# Patient Record
Sex: Female | Born: 2000 | Race: Black or African American | Hispanic: No | Marital: Single | Smoking: Never smoker
Health system: Southern US, Community
[De-identification: ages and names within clinical notes are randomized; demographics above are authoritative.]

---
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
## 2021-06-07 ENCOUNTER — Emergency Department
Admission: EM | Admit: 2021-06-07 | Discharge: 2021-06-07 | Disposition: A | Discharge status: Home / Self Care | Payer: Medicaid Other | Attending: Emergency Medicine | Admitting: Emergency Medicine

## 2021-06-07 ENCOUNTER — Other Ambulatory Visit: Payer: Self-pay

## 2021-06-07 ENCOUNTER — Emergency Department: Payer: Medicaid Other

## 2021-06-07 DIAGNOSIS — W208XXA Other cause of strike by thrown, projected or falling object, initial encounter: Secondary | ICD-10-CM | POA: Insufficient documentation

## 2021-06-07 DIAGNOSIS — M25571 Pain in right ankle and joints of right foot: Secondary | ICD-10-CM | POA: Insufficient documentation

## 2021-06-07 MED ORDER — MELOXICAM 15 MG PO TABS: 15.0000 mg | ORAL_TABLET | Freq: Every day | ORAL | 2 refills | Status: AC

## 2021-06-07 NOTE — ED Notes (Signed)
Ace wrap applied to the patients right ankle. ?

## 2021-06-07 NOTE — ED Triage Notes (Signed)
Pt presents to ER c/o right foot pain.  Pt states she was walking in [employer] when a box fell off the shelf and landed on her right foot.  Pt states she is having pain to lateral aspect of left foot at this time.  No swelling noted to foot.  Foot is tender to palpation.  Pt ambulatory to triage.  A&O x4 at this time in NAD.   ?

## 2021-06-07 NOTE — ED Provider Notes (Signed)
? ?[facility] ?Provider Note ? ?Patient Contact: 10:04 PM (approximate) ? ? ?History  ? ?Foot Pain ? ? ?HPI ? ?Kathy Blake is a 21 y.o. female presents to the emergency department with acute right ankle and right foot pain after a box fell on her foot at [facility].  Patient has had difficulty bearing weight since injury occurred.  No other new falls or mechanisms of trauma. ?  ? ? ?Physical Exam  ? ?Triage Vital Signs: ?ED Triage Vitals  ?Enc Vitals Group  ?   BP 06/07/21 1951 (!) 143/81  ?   Pulse Rate 06/07/21 1951 92  ?   Resp 06/07/21 1951 18  ?   Temp 06/07/21 1951 99.4 ?F (37.4 ?C)  ?   Temp Source 06/07/21 1951 Oral  ?   SpO2 06/07/21 1951 98 %  ?   Weight 06/07/21 1952 205 lb (93 kg)  ?   Height 06/07/21 1952 5\' 2"  (1.575 m)  ?   Head Circumference --   ?   Peak Flow --   ?   Pain Score 06/07/21 1952 8  ?   Pain Loc --   ?   Pain Edu? --   ?   Excl. in GC? --   ? ? ?Most recent vital signs: ?Vitals:  ? 06/07/21 1951  ?BP: (!) 143/81  ?Pulse: 92  ?Resp: 18  ?Temp: 99.4 ?F (37.4 ?C)  ?SpO2: 98%  ? ? ? ?General: Alert and in no acute distress. ?Eyes:  PERRL. EOMI. ?Head: No acute traumatic findings ?ENT ?     Ears:  ?     Nose: No congestion/rhinnorhea. ?     Mouth/Throat: Mucous membranes are moist. ?Neck: No stridor. No cervical spine tenderness to palpation. ?Cardiovascular:  Good peripheral perfusion ?Respiratory: Normal respiratory effort without tachypnea or retractions. Lungs CTAB. Good air entry to the bases with no decreased or absent breath sounds. ?Gastrointestinal: Bowel sounds ?4 quadrants. Soft and nontender to palpation. No guarding or rigidity. No palpable masses. No distention. No CVA tenderness. ?Musculoskeletal: Full range of motion to all extremities.  Patient performs full range of motion at the right ankle.  Palpable dorsalis pedis pulse, right.  Capillary refill less than 2 seconds on the right. ?Neurologic:  No gross focal neurologic deficits are  appreciated.  ?Skin:   No rash noted ?Other: ? ? ?ED Results / Procedures / Treatments  ? ?Labs ?(all labs ordered are listed, but only abnormal results are displayed) ?Labs Reviewed - No data to display ? ? ? ? ? ?RADIOLOGY ? ?I personally viewed and evaluated these images as part of my medical decision making, as well as reviewing the written report by the radiologist. ? ?ED Provider Interpretation: I personally reviewed x-rays of the right foot and ankle and no acute bony abnormality was visualized. ? ? ?PROCEDURES: ? ?Critical Care performed: No ? ?Procedures ? ? ?MEDICATIONS ORDERED IN ED: ?Medications - No data to display ? ? ?IMPRESSION / MDM / ASSESSMENT AND PLAN / ED COURSE  ?I reviewed the triage vital signs and the nursing notes. ?             ?               ? ? ?Assessment and Plan:  ?Differential diagnosis includes, but is not limited to, ankle sprain versus fracture ? ?21 year old female presents to the emergency department with acute right ankle and foot pain.  I personally reviewed x-rays  of right foot and ankle and no acute bony abnormalities were visualized.  Patient was discharged with meloxicam and rest, ice, compression and elevation were recommended.  She was advised to follow-up with podiatry as needed. ? ?Clinical Course as of 06/07/21 2204  ?Thu Jun 07, 2021  ?2050 DG Foot Complete Right [KL]  ?  ?Clinical Course User Index ?[KL] Nancy Marus, Student-PA  ? ? ? ?FINAL CLINICAL IMPRESSION(S) / ED DIAGNOSES  ? ?Final diagnoses:  ?Acute right ankle pain  ? ? ? ?Rx / DC Orders  ? ?ED Discharge Orders   ? ?      Ordered  ?  meloxicam (MOBIC) 15 MG tablet  Daily       ? 06/07/21 2148  ? ?  ?  ? ?  ? ? ? ?Note:  This document was prepared using Dragon voice recognition software and may include unintentional dictation errors. ?  ?Orvil Feil, [place] ?06/07/21 2207 ? ?  ?Sharman Cheek, MD ?06/08/21 1653 ? ?

## 2021-06-07 NOTE — Discharge Instructions (Signed)
Take meloxicam once daily for pain and inflammation. 

## 2021-09-09 ENCOUNTER — Emergency Department
Admission: EM | Admit: 2021-09-09 | Discharge: 2021-09-09 | Disposition: A | Discharge status: Home / Self Care | Payer: Medicaid Other | Attending: Emergency Medicine | Admitting: Emergency Medicine

## 2021-09-09 ENCOUNTER — Encounter: Payer: Self-pay | Admitting: Emergency Medicine

## 2021-09-09 ENCOUNTER — Other Ambulatory Visit: Payer: Self-pay

## 2021-09-09 DIAGNOSIS — N946 Dysmenorrhea, unspecified: Secondary | ICD-10-CM | POA: Insufficient documentation

## 2021-09-09 LAB — POC URINE PREG, ED: Preg Test, Ur: NEGATIVE

## 2021-09-09 MED ORDER — NAPROXEN 500 MG PO TABS: 500.0000 mg | ORAL_TABLET | Freq: Two times a day (BID) | ORAL | 2 refills | Status: AC

## 2021-09-09 MED ORDER — KETOROLAC TROMETHAMINE 30 MG/ML IJ SOLN
30.0000 mg | Freq: Once | INTRAMUSCULAR | Status: AC
  Administered 2021-09-09: 30 mg via INTRAMUSCULAR
  Filled 2021-09-09: qty 1

## 2021-09-09 NOTE — ED Triage Notes (Signed)
Pt reports her menstrual cramps are worse this time than they usually are and she is getting no relief from ibuprofen or tylenol. Pt states usually has painful periods but can usually take of the pain at home

## 2021-09-09 NOTE — ED Provider Notes (Signed)
[facility] Provider Note    Event Date/Time   First MD Initiated Contact with Patient 09/09/21 [phone]     (approximate)   History   Menstrual cramps   HPI  Kathy Blake is a 21 y.o. female who is otherwise healthy presents emergency department with heavy menstrual cramps and bleeding.  Patient states that since she came off birth control her cramps and bleeding have been worse.  States it was so bad she could not stay at work.  She states she goes to school and work but the cramps are so bad for the last 4 days she just cannot go.  Patient took 1 Tylenol and 1 ibuprofen for pain.      Physical Exam   Triage Vital Signs: ED Triage Vitals  Enc Vitals Group     BP 09/09/21 0739 (!) 149/81     Pulse Rate 09/09/21 0739 94     Resp 09/09/21 0739 19     Temp 09/09/21 0739 98.1 F (36.7 C)     Temp Source 09/09/21 0739 Oral     SpO2 09/09/21 0739 99 %     Weight 09/09/21 0734 205 lb 0.4 oz (93 kg)     Height 09/09/21 0734 5\' 2"  (1.575 m)     Head Circumference --      Peak Flow --      Pain Score 09/09/21 0734 9     Pain Loc --      Pain Edu? --      Excl. in GC? --     Most recent vital signs: Vitals:   09/09/21 0739  BP: (!) 149/81  Pulse: 94  Resp: 19  Temp: 98.1 F (36.7 C)  SpO2: 99%     General: Awake, no distress.   CV:  Good peripheral perfusion. regular rate and  rhythm Resp:  Normal effort.  Abd:  No distention.  Abdomen is nontender Other:      ED Results / Procedures / Treatments   Labs (all labs ordered are listed, but only abnormal results are displayed) Labs Reviewed  POC URINE PREG, ED     EKG     RADIOLOGY     PROCEDURES:   Procedures   MEDICATIONS ORDERED IN ED: Medications  ketorolac (TORADOL) 30 MG/ML injection 30 mg (has no administration in time range)     IMPRESSION / MDM / ASSESSMENT AND PLAN / ED COURSE  I reviewed the triage vital signs and the nursing notes.                               Differential diagnosis includes, but is not limited to, miscarriage, ectopic, menstrual cramps, vaginal bleeding  Patient's presentation is most consistent with acute complicated illness / injury requiring diagnostic workup.   Due to the amount of cramping/pain I do feel we should make sure the patient is not pregnant.  POC pregnancy was ordered.  If that is negative patient will be given Toradol for her pain.  Then she will be discharged with a school note work note.  I did discuss that she did not take enough ibuprofen and Tylenol for pain.  Explained to her that she needs to take at least 3 over-the-counter ibuprofen for menstrual cramps or she could take 2 over-the-counter Aleve twice daily.  POC pregnancy test is negative.  Do not feel that the patient is having a miscarriage or ectopic  pregnancy.  I do feel this is basically concerning a work note and pain medication.  She was given Toradol 30 mg IM while here in the ED.  She will be given a prescription for Naprosyn 500 twice daily.  She is to follow-up with her regular doctor if not improving 3 days.  She was discharged stable condition and given a work note.   FINAL CLINICAL IMPRESSION(S) / ED DIAGNOSES   Final diagnoses:  Menstrual cramps     Rx / DC Orders   ED Discharge Orders          Ordered    naproxen (NAPROSYN) 500 MG tablet  2 times daily with meals        09/09/21 0801             Note:  This document was prepared using Dragon voice recognition software and may include unintentional dictation errors.    Faythe Ghee, PA-C 09/09/21 Laveda Abbe    Shaune Pollack, MD 09/12/21 [phone]

## 2021-09-09 NOTE — Discharge Instructions (Signed)
Follow-up with your regular doctor.  Return if worsening.  Take medication as prescribed when you are having your menstrual cycle.

## 2022-04-06 ENCOUNTER — Other Ambulatory Visit: Payer: Self-pay

## 2022-04-06 ENCOUNTER — Emergency Department
Admission: EM | Admit: 2022-04-06 | Discharge: 2022-04-07 | Disposition: A | Discharge status: Home / Self Care | Payer: Medicaid Other | Attending: Emergency Medicine | Admitting: Emergency Medicine

## 2022-04-06 ENCOUNTER — Emergency Department: Payer: Medicaid Other

## 2022-04-06 DIAGNOSIS — R0602 Shortness of breath: Secondary | ICD-10-CM | POA: Diagnosis present

## 2022-04-06 DIAGNOSIS — Z1152 Encounter for screening for COVID-19: Secondary | ICD-10-CM | POA: Diagnosis not present

## 2022-04-06 DIAGNOSIS — J101 Influenza due to other identified influenza virus with other respiratory manifestations: Secondary | ICD-10-CM | POA: Diagnosis not present

## 2022-04-06 LAB — CBC WITH DIFFERENTIAL/PLATELET
Abs Immature Granulocytes: 0.01 10*3/uL (ref 0.00–0.07)
Basophils Absolute: 0 10*3/uL (ref 0.0–0.1)
Basophils Relative: 1 %
Eosinophils Absolute: 0 10*3/uL (ref 0.0–0.5)
Eosinophils Relative: 0 %
HCT: 41.2 % (ref 36.0–46.0)
Hemoglobin: 13.6 g/dL (ref 12.0–15.0)
Immature Granulocytes: 0 %
Lymphocytes Relative: 34 %
Lymphs Abs: 1.3 10*3/uL (ref 0.7–4.0)
MCH: 29.7 pg (ref 26.0–34.0)
MCHC: 33 g/dL (ref 30.0–36.0)
MCV: 90 fL (ref 80.0–100.0)
Monocytes Absolute: 0.6 10*3/uL (ref 0.1–1.0)
Monocytes Relative: 15 %
Neutro Abs: 1.9 10*3/uL (ref 1.7–7.7)
Neutrophils Relative %: 50 %
Platelets: 191 10*3/uL (ref 150–400)
RBC: 4.58 MIL/uL (ref 3.87–5.11)
RDW: 13.3 % (ref 11.5–15.5)
WBC: 3.7 10*3/uL — ABNORMAL LOW (ref 4.0–10.5)
nRBC: 0 % (ref 0.0–0.2)

## 2022-04-06 LAB — COMPREHENSIVE METABOLIC PANEL
ALT: 10 U/L (ref 0–44)
AST: 22 U/L (ref 15–41)
Albumin: 4.1 g/dL (ref 3.5–5.0)
Alkaline Phosphatase: 53 U/L (ref 38–126)
Anion gap: 12 (ref 5–15)
BUN: 6 mg/dL (ref 6–20)
CO2: 20 mmol/L — ABNORMAL LOW (ref 22–32)
Calcium: 8.7 mg/dL — ABNORMAL LOW (ref 8.9–10.3)
Chloride: 104 mmol/L (ref 98–111)
Creatinine, Ser: 0.92 mg/dL (ref 0.44–1.00)
GFR, Estimated: >60 mL/min (ref >60)
Glucose, Bld: 90 mg/dL (ref 70–99)
Potassium: 3.3 mmol/L — ABNORMAL LOW (ref 3.5–5.1)
Sodium: 136 mmol/L (ref 135–145)
Total Bilirubin: 0.6 mg/dL (ref 0.3–1.2)
Total Protein: 7.9 g/dL (ref 6.5–8.1)

## 2022-04-06 LAB — RESP PANEL BY RT-PCR (RSV, FLU A&B, COVID)  RVPGX2
Influenza A by PCR: NEGATIVE
Influenza B by PCR: POSITIVE — AB
Resp Syncytial Virus by PCR: NEGATIVE
SARS Coronavirus 2 by RT PCR: NEGATIVE

## 2022-04-06 LAB — POC URINE PREG, ED: Preg Test, Ur: NEGATIVE

## 2022-04-06 MED ORDER — IPRATROPIUM-ALBUTEROL 0.5-2.5 (3) MG/3ML IN SOLN
3.0000 mL | Freq: Once | RESPIRATORY_TRACT | Status: AC
  Administered 2022-04-06: 3 mL via RESPIRATORY_TRACT
  Filled 2022-04-06: qty 3

## 2022-04-06 MED ORDER — ACETAMINOPHEN 325 MG PO TABS
650.0000 mg | ORAL_TABLET | Freq: Once | ORAL | Status: AC
  Administered 2022-04-06: 650 mg via ORAL
  Filled 2022-04-06: qty 2

## 2022-04-06 MED ORDER — POTASSIUM CHLORIDE CRYS ER 20 MEQ PO TBCR
40.0000 meq | EXTENDED_RELEASE_TABLET | Freq: Once | ORAL | Status: AC
  Administered 2022-04-06: 40 meq via ORAL
  Filled 2022-04-06: qty 2

## 2022-04-06 MED ORDER — SODIUM CHLORIDE 0.9 % IV BOLUS
1000.0000 mL | Freq: Once | INTRAVENOUS | Status: AC
  Administered 2022-04-06: 1000 mL via INTRAVENOUS

## 2022-04-06 NOTE — ED Triage Notes (Signed)
Pt to ED from home for vomiting. Pt has SOB when she lays down flat. Pt states this started since 0200 this morning. Flu-like symptoms.   Pt has been up since 0200AM. Pt is on 3 different meds for a workers comp injury to her back. She is on tramadol, triazazine and meloxicam.

## 2022-04-06 NOTE — ED Provider Notes (Incomplete)
-----------------------------------------   11:35 PM on 04/06/2022 -----------------------------------------  Blood pressure 126/82, pulse 90, temperature 99.5 F (37.5 C), temperature source Oral, resp. rate 18, height 5\' 1"  (1.549 m), weight 95.3 kg, last menstrual period 04/01/2022, SpO2 94 %.  Assuming care from PA Fisher.  In short, Kathy Blake is a 21 y.o. female with a chief complaint of flu-like symptoms .  Refer to the original H&P for additional details.  The current plan of care is to follow-up troponin and reassess for difficulty breathing in patient with influenza.

## 2022-04-06 NOTE — ED Provider Notes (Cosign Needed)
[facility] Provider Note    Event Date/Time   First MD Initiated Contact with Patient 04/06/22 2201     (approximate)   History   flu-like symptoms   HPI  MAKELA NIEHOFF is a 21 y.o. female with no significant past medical history presents emergency department complaining of vomiting, some shortness of breath when she lays down, fever, chills and bodyaches.  Symptoms for 3 days.  Is on pain medications for her Worker's Comp.  See nurses note for meds      Physical Exam   Triage Vital Signs: ED Triage Vitals [04/06/22 2020]  Enc Vitals Group     BP 116/84     Pulse Rate (!) 125     Resp 16     Temp 99.5 F (37.5 C)     Temp Source Oral     SpO2 99 %     Weight 210 lb (95.3 kg)     Height 5\' 1"  (1.549 m)     Head Circumference      Peak Flow      Pain Score 0     Pain Loc      Pain Edu?      Excl. in GC?     Most recent vital signs: Vitals:   04/06/22 2230 04/06/22 2330  BP: 126/82 (!) 110/91  Pulse: 90 (!) 104  Resp: 18 (!) 22  Temp:  99.5 F (37.5 C)  SpO2: 94% 97%     General: Awake, no distress.   CV:  Good peripheral perfusion.  Tachycardic, regular rhythm Resp:  Normal effort. Lungs cta, patient is having to take deep breaths after talking Abd:  No distention.   Other:      ED Results / Procedures / Treatments   Labs (all labs ordered are listed, but only abnormal results are displayed) Labs Reviewed  RESP PANEL BY RT-PCR (RSV, FLU A&B, COVID)  RVPGX2 - Abnormal; Notable for the following components:      Result Value   Influenza B by PCR POSITIVE (*)    All other components within normal limits  COMPREHENSIVE METABOLIC PANEL - Abnormal; Notable for the following components:   Potassium 3.3 (*)    CO2 20 (*)    Calcium 8.7 (*)    All other components within normal limits  CBC WITH DIFFERENTIAL/PLATELET - Abnormal; Notable for the following components:   WBC 3.7 (*)    All other components within normal  limits  URINALYSIS, ROUTINE W REFLEX MICROSCOPIC  POC URINE PREG, ED  TROPONIN I (HIGH SENSITIVITY)     EKG  EKG   RADIOLOGY Chest x-ray    PROCEDURES:   Procedures   MEDICATIONS ORDERED IN ED: Medications  sodium chloride 0.9 % bolus 1,000 mL (1,000 mLs Intravenous New Bag/Given 04/06/22 2224)  acetaminophen (TYLENOL) tablet 650 mg (650 mg Oral Given 04/06/22 2336)  ipratropium-albuterol (DUONEB) 0.5-2.5 (3) MG/3ML nebulizer solution 3 mL (3 mLs Nebulization Given 04/06/22 2336)  potassium chloride SA (KLOR-CON M) CR tablet 40 mEq (40 mEq Oral Given 04/06/22 2339)     IMPRESSION / MDM / ASSESSMENT AND PLAN / ED COURSE  I reviewed the triage vital signs and the nursing notes.                              Differential diagnosis includes, but is not limited to, COVID, influenza, RSV, CAP, PE, CHF, viral gastroenteritis  Patient's presentation is most consistent with acute presentation with potential threat to life or bodily function.   I have great concerns over the patient's breathing, and rapid heart rate.  Will go ahead and give her 1 L normal saline IV, I will reevaluate her breathing and heart rate after the 1 L.  Also added labs to the patient's course of treatment at this time.  Her respiratory panel was positive for influenza B.   Chest x-ray independently reviewed and interpreted by me as being negative for any acute abnormality  Labs are reassuring  EKG shows normal sinus rhythm at 90 bpm, see physician read  Patient is feeling a little better with some of the fluids.  Will give her a DuoNeb to help her with her breathing. Temperature increased to 100.7.  Will go ahead and give her Tylenol along with a DuoNeb.  Discussed her symptoms and labs with DR Charna Archer, he will be assuming care of the patient, added troponin and will evaluate the patient   FINAL CLINICAL IMPRESSION(S) / ED DIAGNOSES   Final diagnoses:  Influenza B     Rx / DC Orders    ED Discharge Orders     None        Note:  This document was prepared using Dragon voice recognition software and may include unintentional dictation errors.    Versie Starks, PA-C 04/06/22 2340

## 2022-04-07 LAB — URINALYSIS, ROUTINE W REFLEX MICROSCOPIC
Bilirubin Urine: NEGATIVE
Glucose, UA: NEGATIVE mg/dL
Hgb urine dipstick: NEGATIVE
Ketones, ur: 20 mg/dL — AB
Leukocytes,Ua: NEGATIVE
Nitrite: NEGATIVE
Protein, ur: NEGATIVE mg/dL
Specific Gravity, Urine: 1.006 (ref 1.005–1.030)
pH: 7 (ref 5.0–8.0)

## 2022-04-07 LAB — TROPONIN I (HIGH SENSITIVITY): Troponin I (High Sensitivity): <2 ng/L (ref <18)

## 2022-04-07 MED ORDER — ALBUTEROL SULFATE HFA 108 (90 BASE) MCG/ACT IN AERS
2.0000 | INHALATION_SPRAY | Freq: Four times a day (QID) | RESPIRATORY_TRACT | 0 refills | Status: AC | PRN
Start: 2022-04-07 — End: ?

## 2022-04-07 MED ORDER — ONDANSETRON 4 MG PO TBDP
4.0000 mg | ORAL_TABLET | Freq: Three times a day (TID) | ORAL | 0 refills | Status: AC | PRN
Start: 2022-04-07 — End: ?

## 2022-04-10 ENCOUNTER — Telehealth: Payer: Self-pay

## 2022-04-10 NOTE — Telephone Encounter (Signed)
Transition Care Management Unsuccessful Follow-up Telephone Call  Date of discharge and from where:  04/07/22, [facility] ER  Attempts:  1st Attempt  Reason for unsuccessful TCM follow-up call:  Left voice message

## 2022-04-11 NOTE — Telephone Encounter (Signed)
Transition Care Management Unsuccessful Follow-up Telephone Call  Date of discharge and from where:  04/07/22, [facility] ER  Attempts:  2nd Attempt  Reason for unsuccessful TCM follow-up call:  Left voice message

## 2022-04-11 NOTE — Telephone Encounter (Signed)
Transition Care Management Unsuccessful Follow-up Telephone Call  Date of discharge and from where:  04/07/22, [facility] ER  Attempts:  3rd Attempt  Reason for unsuccessful TCM follow-up call:  Left voice message

## 2023-09-03 ENCOUNTER — Ambulatory Visit: Payer: Medicaid Other | Admitting: Dermatology

## 2024-03-22 ENCOUNTER — Encounter: Payer: Self-pay | Admitting: Physician Assistant

## 2024-03-22 ENCOUNTER — Ambulatory Visit: Admitting: Physician Assistant

## 2024-03-22 VITALS — BP 116/74

## 2024-03-22 DIAGNOSIS — L732 Hidradenitis suppurativa: Secondary | ICD-10-CM

## 2024-03-22 MED ORDER — CLINDAMYCIN PHOSPHATE 1 % EX LOTN: TOPICAL_LOTION | Freq: Every day | CUTANEOUS | 6 refills | Status: AC

## 2024-03-22 MED ORDER — CHLORHEXIDINE GLUCONATE 4 % EX SOLN: Freq: Every day | CUTANEOUS | 6 refills | Status: AC

## 2024-03-22 NOTE — Patient Instructions (Signed)

## 2024-03-22 NOTE — Progress Notes (Signed)
° °  New Patient Visit   Subjective  Kathy Blake is a 23 y.o. female NEW PATIENT who presents for the following: HS - axillae, thighs, groin area and buttocks. She has had NdYAG laser x 3 treatments at [facility] to treat the hair  - the last treatment was 02/07/2021. These procedures greatly improved her HS. The flares started almost immediately after her last treatment. She washes with Panoxyl 10 twice daily. She has washed with Hibiclens  in the past. In the past, she has taken doxycycline and used clindamycin , mometasone and hydrocortisone ointment. Is wanting referral for repeat laser treatment, if possible.    The following portions of the chart were reviewed this encounter and updated as appropriate: medications, allergies, medical history  Review of Systems:  No other skin or systemic complaints except as noted in HPI or Assessment and Plan.  Objective  Well appearing patient in no apparent distress; mood and affect are within normal limits.  A focused examination was performed of the following areas: Legs, groin area, buttocks  Relevant exam findings are noted in the Assessment and Plan.    Assessment & Plan   HIDRADENITIS SUPPURATIVA  Exam: scars, acneiform nodules and resolving papules - axillae, groin, and gluteal cleft   Inadequately controlled    Hidradenitis Suppurativa is a chronic; persistent; non-curable, but treatable condition due to abnormal inflamed sweat glands in the body folds (axilla, inframammary, groin, medial thighs), causing recurrent painful draining cysts and scarring. It can be associated with severe scarring acne and cysts; also abscesses and scarring of scalp. The goal is control and prevention of flares, as it is not curable. Scars are permanent and can be thickened. Treatment may include daily use of topical medication and oral antibiotics.  Oral isotretinoin may also be helpful.  For some cases, Humira or Cosentyx (biologic injections) may be prescribed to  decrease the inflammatory process and prevent flares.  When indicated, inflamed cysts may also be treated surgically.  Treatment Plan: Patient did well in the past with NdYag laser treatments and she would like to resume treatment. We will make a referral to [facility] for treatments.  Recommend she alternate washing areas with Dial Antibacterial soap, Hibiclens , Panoxyl 10 wash.  Start Clindamycin  lotion Apply to affected areas daily.  HIDRADENITIS SUPPURATIVA   This Visit - Ambulatory referral to Dermatology    Return if symptoms worsen or fail to improve.  I, Roseline Hutchinson, CMA, am acting as scribe for Mikesha Migliaccio K, PA-C .   Documentation: I have reviewed the above documentation for accuracy and completeness, and I agree with the above.  Zhamir Pirro K, PA-C
